# Patient Record
Sex: Female | Born: 1955 | Race: White | Hispanic: No | Marital: Married | Smoking: Never smoker
Health system: Southern US, Community
[De-identification: ages and names within clinical notes are randomized; demographics above are authoritative.]

## PROBLEM LIST (undated history)

## (undated) DIAGNOSIS — I10 Essential (primary) hypertension: Secondary | ICD-10-CM

## (undated) DIAGNOSIS — M810 Age-related osteoporosis without current pathological fracture: Secondary | ICD-10-CM

---

## 2007-07-27 ENCOUNTER — Ambulatory Visit: Payer: Self-pay | Admitting: Gastroenterology

## 2011-05-15 ENCOUNTER — Ambulatory Visit: Payer: Self-pay

## 2012-02-18 ENCOUNTER — Ambulatory Visit: Payer: Self-pay | Admitting: Medical

## 2014-09-20 ENCOUNTER — Ambulatory Visit: Payer: Self-pay | Admitting: Physician Assistant

## 2014-09-20 LAB — URINALYSIS, COMPLETE
BACTERIA: NEGATIVE
BILIRUBIN, UR: NEGATIVE
Glucose,UR: NEGATIVE
KETONE: NEGATIVE
Leukocyte Esterase: NEGATIVE
NITRITE: NEGATIVE
PH: 7 (ref 5.0–8.0)
Protein: NEGATIVE
Specific Gravity: 1.01 (ref 1.000–1.030)
Squamous Epithelial: NONE SEEN
WBC UR: NONE SEEN /HPF (ref 0–5)

## 2014-09-22 LAB — URINE CULTURE

## 2017-06-04 ENCOUNTER — Other Ambulatory Visit: Payer: Self-pay | Admitting: Family Medicine

## 2017-06-04 ENCOUNTER — Ambulatory Visit: Payer: 59

## 2017-06-04 DIAGNOSIS — M79662 Pain in left lower leg: Secondary | ICD-10-CM

## 2017-08-12 ENCOUNTER — Other Ambulatory Visit: Payer: Self-pay | Admitting: Student

## 2017-08-12 DIAGNOSIS — S83512S Sprain of anterior cruciate ligament of left knee, sequela: Secondary | ICD-10-CM

## 2017-08-18 ENCOUNTER — Encounter: Payer: Self-pay | Admitting: Radiology

## 2017-08-18 ENCOUNTER — Ambulatory Visit
Admission: RE | Admit: 2017-08-18 | Discharge: 2017-08-18 | Disposition: A | Discharge status: Home / Self Care | Payer: Commercial Managed Care - HMO | Source: Ambulatory Visit | Attending: Student | Admitting: Student

## 2017-08-18 DIAGNOSIS — S83521S Sprain of posterior cruciate ligament of right knee, sequela: Secondary | ICD-10-CM | POA: Diagnosis not present

## 2017-08-18 DIAGNOSIS — X58XXXS Exposure to other specified factors, sequela: Secondary | ICD-10-CM | POA: Diagnosis not present

## 2017-08-18 DIAGNOSIS — S83512S Sprain of anterior cruciate ligament of left knee, sequela: Secondary | ICD-10-CM

## 2019-11-30 ENCOUNTER — Emergency Department
Admission: EM | Admit: 2019-11-30 | Discharge: 2019-11-30 | Disposition: A | Discharge status: Home / Self Care | Payer: BC Managed Care – PPO | Attending: Emergency Medicine | Admitting: Emergency Medicine

## 2019-11-30 ENCOUNTER — Emergency Department: Payer: BC Managed Care – PPO

## 2019-11-30 ENCOUNTER — Other Ambulatory Visit: Payer: Self-pay

## 2019-11-30 DIAGNOSIS — I10 Essential (primary) hypertension: Secondary | ICD-10-CM | POA: Insufficient documentation

## 2019-11-30 DIAGNOSIS — R0789 Other chest pain: Secondary | ICD-10-CM | POA: Insufficient documentation

## 2019-11-30 DIAGNOSIS — Z79899 Other long term (current) drug therapy: Secondary | ICD-10-CM | POA: Diagnosis not present

## 2019-11-30 DIAGNOSIS — R079 Chest pain, unspecified: Secondary | ICD-10-CM | POA: Diagnosis present

## 2019-11-30 HISTORY — DX: Essential (primary) hypertension: I10

## 2019-11-30 HISTORY — DX: Age-related osteoporosis without current pathological fracture: M81.0

## 2019-11-30 LAB — BASIC METABOLIC PANEL
Anion gap: 8 (ref 5–15)
BUN: 21 mg/dL (ref 8–23)
CO2: 27 mmol/L (ref 22–32)
Calcium: 9.1 mg/dL (ref 8.9–10.3)
Chloride: 107 mmol/L (ref 98–111)
Creatinine, Ser: 0.9 mg/dL (ref 0.44–1.00)
GFR calc Af Amer: >60 mL/min (ref >60)
GFR calc non Af Amer: >60 mL/min (ref >60)
Glucose, Bld: 98 mg/dL (ref 70–99)
Potassium: 4 mmol/L (ref 3.5–5.1)
Sodium: 142 mmol/L (ref 135–145)

## 2019-11-30 LAB — CBC
HCT: 40.4 % (ref 36.0–46.0)
Hemoglobin: 13.3 g/dL (ref 12.0–15.0)
MCH: 30.9 pg (ref 26.0–34.0)
MCHC: 32.9 g/dL (ref 30.0–36.0)
MCV: 93.7 fL (ref 80.0–100.0)
Platelets: 195 10*3/uL (ref 150–400)
RBC: 4.31 MIL/uL (ref 3.87–5.11)
RDW: 13.3 % (ref 11.5–15.5)
WBC: 6.7 10*3/uL (ref 4.0–10.5)
nRBC: 0 % (ref 0.0–0.2)

## 2019-11-30 LAB — TROPONIN I (HIGH SENSITIVITY)
Troponin I (High Sensitivity): 4 ng/L (ref <18)
Troponin I (High Sensitivity): 5 ng/L (ref <18)

## 2019-11-30 NOTE — ED Notes (Signed)
followup discussed with patient.

## 2019-11-30 NOTE — Discharge Instructions (Addendum)
Return to the ER for new, worsening, or persistent severe chest pain, difficulty breathing, weakness or lightheadedness, or any other new or worsening symptoms that concern you.  Follow-up with your primary care doctor. 

## 2019-11-30 NOTE — ED Provider Notes (Signed)
[facility] Emergency Department Provider Note ____________________________________________   First MD Initiated Contact with Patient 11/30/19 0500     (approximate)  I have reviewed the triage vital signs and the nursing notes.   HISTORY  Chief Complaint Chest Pain    HPI Melissa Wood is a 64 y.o. female with PMH as noted below (and no prior cardiac history) who presents with chest pain, acute onset about 1 hour prior to ED arrival, mainly occurring under her left breast and radiating to left arm, and described as both sharp but also like a tightness.  She states that she felt like it took her breath away.  She had 2 episodes of this pain.  It has now mostly subsided.  She states she had some nausea and felt lightheaded, but did not vomit.  She does not currently feel short of breath.  She has had no cough or fever.  She was feeling fine yesterday other than having a migraine headache.  She denies any prior history of this pain.  Past Medical History:  Diagnosis Date  . Hypertension   . Osteoporosis     There are no problems to display for this patient.   History reviewed. No pertinent surgical history.  Prior to Admission medications   Medication Sig Start Date End Date Taking? Authorizing Provider  Calcium Carb-Cholecalciferol (OYSTER SHELL CALCIUM) 500-400 MG-UNIT TABS Take 1 tablet by mouth 2 (two) times daily.   Yes [provider]  diphenhydrAMINE ([employer] ALLERGY) 25 MG tablet Take 25 mg by mouth at bedtime.   Yes [provider]  fluticasone (FLONASE) 50 MCG/ACT nasal spray Place 2 sprays into the nose daily as needed.   Yes [provider]  lisinopril (ZESTRIL) 5 MG tablet Take 5 mg by mouth daily. 07/25/19 01/25/20 Yes [provider]  magic mouthwash SOLN Take 5 mLs by mouth See admin instructions. Swish and spit 5 mls every 6  hours as needed. 08/30/18  Yes [provider]  naproxen sodium (ALEVE) 220  MG tablet Take 440 mg by mouth daily as needed.   Yes [provider]  Olopatadine HCl (PAZEO) 0.7 % SOLN Place 1 drop into both eyes daily as needed. 04/08/16  Yes [provider]  prednisoLONE acetate (PRED FORTE) 1 % ophthalmic suspension Place 1 drop into both eyes daily as needed. 11/27/15  Yes [provider]  Probiotic Product (PROBIOTIC-10 PO) Take 1 capsule by mouth daily.   Yes [provider]  raloxifene (EVISTA) 60 MG tablet Take 60 mg by mouth daily. 09/22/19  Yes [provider]  rizatriptan (MAXALT) 10 MG tablet 10 mg See admin instructions. Take 1 tablet by mouth once as needed for migraine. 07/25/19  Yes [provider]    Allergies Aspirin  No family history on file.  Social History Social History   Tobacco Use  . Smoking status: Never Smoker  . Smokeless tobacco: Never Used  Substance Use Topics  . Alcohol use: Not on file  . Drug use: Not on file    Review of Systems  Constitutional: No fever. Eyes: No redness. ENT: No sore throat. Cardiovascular: Positive for resolving chest pain. Respiratory: Positive for resolved shortness of breath. Gastrointestinal: No vomiting or diarrhea.  Genitourinary: Negative for flank pain.  Musculoskeletal: Negative for back pain. Skin: Negative for rash. Neurological: Negative for headache.   ____________________________________________   PHYSICAL EXAM:  VITAL SIGNS: ED Triage Vitals  Enc Vitals Group  BP 11/30/19 0440 (!) 161/94     Pulse Rate 11/30/19 0440 77     Resp 11/30/19 0440 16     Temp 11/30/19 0440 97.8 F (36.6 C)     Temp Source 11/30/19 0440 Oral     SpO2 11/30/19 0434 100 %     Weight 11/30/19 0435 135 lb (61.2 kg)     Height 11/30/19 0435 5\' 4"  (1.626 m)     Head Circumference --      Peak Flow --      Pain Score 11/30/19 0435 1     Pain Loc --      Pain Edu? --      Excl. in GC? --     Constitutional: Alert and oriented. Well  appearing and in no acute distress. Eyes: Conjunctivae are normal.  Head: Atraumatic. Nose: No congestion/rhinnorhea. Mouth/Throat: Mucous membranes are moist.   Neck: Normal range of motion.  Cardiovascular: Normal rate, regular rhythm. Grossly normal heart sounds.  Good peripheral circulation.  No chest wall tenderness. Respiratory: Normal respiratory effort.  No retractions. Lungs CTAB. Gastrointestinal: No distention.  Musculoskeletal: No lower extremity edema.  No calf or popliteal swelling or tenderness.  Extremities warm and well perfused.  Neurologic:  Normal speech and language. No gross focal neurologic deficits are appreciated.  Skin:  Skin is warm and dry. No rash noted. Psychiatric: Mood and affect are normal. Speech and behavior are normal.  ____________________________________________   LABS (all labs ordered are listed, but only abnormal results are displayed)  Labs Reviewed  BASIC METABOLIC PANEL  CBC  TROPONIN I (HIGH SENSITIVITY)  TROPONIN I (HIGH SENSITIVITY)   ____________________________________________  EKG  ED ECG REPORT I, 12/02/19, the attending physician, personally viewed and interpreted this ECG.  Date: 11/30/2019 EKG Time: 0437 Rate: 75 Rhythm: normal sinus rhythm QRS Axis: normal Intervals: normal ST/T Wave abnormalities: normal Narrative Interpretation: no evidence of acute ischemia  ____________________________________________  RADIOLOGY  CXR: No focal infiltrate or other acute abnormality ____________________________________________   PROCEDURES  Procedure(s) performed: No  Procedures  Critical Care performed: No ____________________________________________   INITIAL IMPRESSION / ASSESSMENT AND PLAN / ED COURSE  Pertinent labs & imaging results that were available during my care of the patient were reviewed by me and considered in my medical decision making (see chart for details).  64 year old female with PMH  as noted above and no prior cardiac history presents with atypical chest pain which awoke her from sleep, and which was described as both sharp and pressure-like.  The patient received nitroglycerin by EMS, and states that the pain is now mostly subsided.  She has no prior history of this.  On exam, she is overall well-appearing.  Her vital signs are normal.  The physical exam is unremarkable.  EKG shows no ischemic findings.  Overall presentation is not consistent with ACS.  Given the patient's age and some of the associated symptoms, as well as the fact that she came in soon after the pain started, we will obtain troponins x2 to rule out ACS.  Also given the improvement in the pain and the lack of tachycardia or hypoxia, I do not suspect a PE.  There is no evidence of aortic dissection or other vascular etiology.  Presentation is most consistent with muscular or nerve pain especially given the fact that it woke her up from sleep and appeared to be somewhat positional initially.  ----------------------------------------- 7:31 AM on 11/30/2019 -----------------------------------------  Initial and repeat troponin are both  negative.  There is no evidence of ACS.  The patient continues to have no recurrence of the severe chest pain in the ED.  She is stable for discharge.  I counseled her on the results of the work-up.  Return precautions given, and she expresses understanding.  ____________________________________________   FINAL CLINICAL IMPRESSION(S) / ED DIAGNOSES  Final diagnoses:  Atypical chest pain      NEW MEDICATIONS STARTED DURING THIS VISIT:  New Prescriptions   No medications on file     Note:  This document was prepared using Dragon voice recognition software and may include unintentional dictation errors.    Arta Silence, MD 11/30/19 [phone]

## 2019-11-30 NOTE — ED Triage Notes (Addendum)
Pt arrives to ED via [facility] from home with c/o chest pain that started at 3:30am. Pt reports left-sided chest pain, described as "tightness and pressure" that radiated into her left arm. Pt denies SHOB, but does report some lightheadedness and nausea. Pt denies any previous cardiac h/x; only medical history per pt is HTN and Osteoporosis. Pt was given 2 sprays of SL Nitro by EMS while en route, pt reports significant improvement in pain (down to 2/10 from 10/10). Pt is A&O, in NAD; RR even, regular, and unlabored. NOTE: Pt has an allergy to Aspirin

## 2021-02-18 ENCOUNTER — Other Ambulatory Visit: Payer: Self-pay

## 2021-02-18 ENCOUNTER — Ambulatory Visit
Admission: EM | Admit: 2021-02-18 | Discharge: 2021-02-18 | Disposition: A | Discharge status: Home / Self Care | Payer: Medicare HMO | Attending: Sports Medicine | Admitting: Sports Medicine

## 2021-02-18 ENCOUNTER — Encounter: Payer: Self-pay | Admitting: Emergency Medicine

## 2021-02-18 DIAGNOSIS — R102 Pelvic and perineal pain: Secondary | ICD-10-CM

## 2021-02-18 DIAGNOSIS — R350 Frequency of micturition: Secondary | ICD-10-CM | POA: Diagnosis present

## 2021-02-18 DIAGNOSIS — R339 Retention of urine, unspecified: Secondary | ICD-10-CM

## 2021-02-18 DIAGNOSIS — N390 Urinary tract infection, site not specified: Secondary | ICD-10-CM | POA: Diagnosis present

## 2021-02-18 DIAGNOSIS — R300 Dysuria: Secondary | ICD-10-CM

## 2021-02-18 LAB — URINALYSIS, COMPLETE (UACMP) WITH MICROSCOPIC
Bilirubin Urine: NEGATIVE
Glucose, UA: NEGATIVE mg/dL
Ketones, ur: NEGATIVE mg/dL
Nitrite: NEGATIVE
Protein, ur: NEGATIVE mg/dL
Specific Gravity, Urine: 1.01 (ref 1.005–1.030)
pH: 7 (ref 5.0–8.0)

## 2021-02-18 MED ORDER — NITROFURANTOIN MONOHYD MACRO 100 MG PO CAPS: 100.0000 mg | ORAL_CAPSULE | Freq: Two times a day (BID) | ORAL | 0 refills | Status: DC

## 2021-02-18 MED ORDER — PHENAZOPYRIDINE HCL 200 MG PO TABS: 200.0000 mg | ORAL_TABLET | Freq: Three times a day (TID) | ORAL | 0 refills | Status: AC

## 2021-02-18 NOTE — ED Triage Notes (Signed)
Pt c/o urinary frequency, retention, and lower abdominal pain. Started yesterday. Denies lower back pain.

## 2021-02-18 NOTE — ED Provider Notes (Signed)
[facility]    CSN: [id number] Arrival date & time: 02/18/21  1614      History   Chief Complaint Chief Complaint  Patient presents with  . Urinary Retention    HPI Melissa Wood is a 65 y.o. female.   Patient is a pleasant 65 year old female who presents for evaluation of the above issues.  Normally sees [facility] but they were unavailable to see her.  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  She reports urinary tract infection symptoms that began yesterday.  She reports a low-grade fever with associated lower suprapubic abdominal pressure.  She also reports dysuria, urinary frequency, increased urinary urgency, and incomplete voiding.  She denies back pain or flank pain.  She has been drinking a lot of water and trying to flush her system.  She denies any pelvic pain or vaginal discharge.  No blood in the urine.  No vaginal bleeding.  She denies any nausea vomiting or diarrhea.  She also denies any chest pain or shortness of breath.  No red flag signs or symptoms elicited on history.      Past Medical History:  Diagnosis Date  . Hypertension   . Osteoporosis     There are no problems to display for this patient.   History reviewed. No pertinent surgical history.  OB History   No obstetric history on file.      Home Medications    Prior to Admission medications   Medication Sig Start Date End Date Taking? Authorizing Provider  Calcium Carb-Cholecalciferol (OYSTER SHELL CALCIUM) 500-400 MG-UNIT TABS Take 1 tablet by mouth 2 (two) times daily.   Yes [provider]  diphenhydrAMINE (BENADRYL) 25 MG tablet Take 25 mg by mouth at bedtime.   Yes [provider]  fluticasone (FLONASE) 50 MCG/ACT nasal spray Place 2 sprays into the nose daily as needed.   Yes [provider]  lisinopril (ZESTRIL) 5 MG tablet Take 5 mg by mouth daily. 07/25/19 02/18/21 Yes [provider]  metoprolol succinate  (TOPROL-XL) 25 MG 24 hr tablet Take 1 tablet by mouth daily. 01/21/21  Yes [provider]  naproxen sodium (ALEVE) 220 MG tablet Take 440 mg by mouth daily as needed.   Yes [provider]  NIFEdipine (ADALAT CC) 30 MG 24 hr tablet Take 30 mg by mouth daily. 01/24/21  Yes [provider]  nitrofurantoin, macrocrystal-monohydrate, (MACROBID) 100 MG capsule Take 1 capsule (100 mg total) by mouth 2 (two) times daily. 02/18/21  Yes Delton See, MD  Olopatadine HCl (PAZEO) 0.7 % SOLN Place 1 drop into both eyes daily as needed. 04/08/16  Yes [provider]  phenazopyridine (PYRIDIUM) 200 MG tablet Take 1 tablet (200 mg total) by mouth 3 (three) times daily. 02/18/21  Yes Delton See, MD  prednisoLONE acetate (PRED FORTE) 1 % ophthalmic suspension Place 1 drop into both eyes daily as needed. 11/27/15  Yes [provider]  Probiotic Product (PROBIOTIC-10 PO) Take 1 capsule by mouth daily.   Yes [provider]  raloxifene (EVISTA) 60 MG tablet Take 60 mg by mouth daily. 09/22/19  Yes [provider]  magic mouthwash SOLN Take 5 mLs by mouth See admin instructions. Swish and spit 5 mls every 6  hours as needed. 08/30/18   [provider]  rizatriptan (MAXALT) 10 MG tablet 10 mg See admin instructions. Take 1 tablet by mouth once as needed for migraine. 07/25/19   [provider]    Family History History reviewed. No pertinent family history.  Social History Social History   Tobacco Use  . Smoking status: Never Smoker  . Smokeless tobacco: Never Used  Vaping Use  . Vaping Use: Never used  Substance Use Topics  . Alcohol use: Not Currently  . Drug use: Not Currently     Allergies   Aspirin   Review of Systems Review of Systems  Constitutional: Positive for fever. Negative for activity change, appetite change, chills, diaphoresis and fatigue.  HENT: Negative for congestion, ear pain, postnasal drip,  rhinorrhea, sinus pressure, sinus pain, sneezing and sore throat.   Eyes: Negative for pain.  Respiratory: Negative for cough, chest tightness and shortness of breath.   Cardiovascular: Negative for chest pain and palpitations.  Gastrointestinal: Positive for abdominal pain. Negative for diarrhea, nausea and vomiting.  Genitourinary: Positive for dysuria, frequency and urgency. Negative for flank pain, genital sores, hematuria, pelvic pain, vaginal bleeding, vaginal discharge and vaginal pain.  Musculoskeletal: Negative for back pain, myalgias and neck pain.  Skin: Negative for color change, pallor, rash and wound.  Neurological: Negative for dizziness, light-headedness and headaches.  All other systems reviewed and are negative.    Physical Exam Triage Vital Signs ED Triage Vitals  Enc Vitals Group     BP 02/18/21 1716 (!) 107/92     Pulse Rate 02/18/21 1716 80     Resp 02/18/21 1716 18     Temp 02/18/21 1716 98.5 F (36.9 C)     Temp Source 02/18/21 1716 Oral     SpO2 02/18/21 1716 100 %     Weight 02/18/21 1717 134 lb 14.7 oz (61.2 kg)     Height 02/18/21 1717 5\' 4"  (1.626 m)     Head Circumference --      Peak Flow --      Pain Score 02/18/21 1716 7     Pain Loc --      Pain Edu? --      Excl. in GC? --    No data found.  Updated Vital Signs BP (!) 107/92 (BP Location: Right Arm)   Pulse 80   Temp 98.5 F (36.9 C) (Oral)   Resp 18   Ht 5\' 4"  (1.626 m)   Wt 61.2 kg   SpO2 100%   BMI 23.16 kg/m   Visual Acuity Right Eye Distance:   Left Eye Distance:   Bilateral Distance:    Right Eye Near:   Left Eye Near:    Bilateral Near:     Physical Exam Vitals and nursing note reviewed.  Constitutional:      General: She is not in acute distress.    Appearance: Normal appearance. She is well-developed. She is not ill-appearing, toxic-appearing or diaphoretic.  HENT:     Head: Normocephalic and atraumatic.     Nose: Nose normal.     Mouth/Throat:     Mouth:  Mucous membranes are moist.  Eyes:     General: No scleral icterus.       Right eye: No discharge.        Left eye: No discharge.     Conjunctiva/sclera: Conjunctivae normal.     Pupils: Pupils are equal, round, and reactive to light.  Cardiovascular:     Rate and Rhythm: Normal rate and regular rhythm.     Pulses: Normal pulses.     Heart sounds: Normal heart sounds. No murmur heard. No friction rub. No gallop.   Pulmonary:  Effort: Pulmonary effort is normal. No respiratory distress.     Breath sounds: Normal breath sounds. No stridor. No wheezing, rhonchi or rales.  Abdominal:     General: Bowel sounds are normal. There is no distension.     Palpations: Abdomen is soft.     Tenderness: There is abdominal tenderness in the suprapubic area. There is no right CVA tenderness, left CVA tenderness, guarding or rebound.  Musculoskeletal:     Cervical back: Normal range of motion and neck supple. No rigidity or tenderness.  Skin:    General: Skin is warm and dry.     Capillary Refill: Capillary refill takes less than 2 seconds.     Findings: No bruising, erythema, lesion or rash.  Neurological:     General: No focal deficit present.     Mental Status: She is alert and oriented to person, place, and time.      UC Treatments / Results  Labs (all labs ordered are listed, but only abnormal results are displayed) Labs Reviewed  URINE CULTURE - Abnormal; Notable for the following components:      Result Value   Culture   (*)    Value: >=100,000 COLONIES/mL ESCHERICHIA COLI SUSCEPTIBILITIES TO FOLLOW Performed at [facility], [address] [place], [place] [address]    All other components within normal limits  URINALYSIS, COMPLETE (UACMP) WITH MICROSCOPIC - Abnormal; Notable for the following components:   Hgb urine dipstick MODERATE (*)    Leukocytes,Ua MODERATE (*)    Bacteria, UA MANY (*)    All other components within normal limits    EKG   Radiology No  results found.  Procedures Procedures (including critical care time)  Medications Ordered in UC Medications - No data to display  Initial Impression / Assessment and Plan / UC Course  I have reviewed the triage vital signs and the nursing notes.  Pertinent labs & imaging results that were available during my care of the patient were reviewed by me and considered in my medical decision making (see chart for details).  Clinical impression: 2 days of UTI symptoms including dysuria, increased urinary frequency, urinary retention, increased urinary urgency, and suprapubic abdominal pain..  Treatment plan: 1.  The findings and treatment plan were discussed in detail with the patient.  Patient was in agreement. 2.  Recommended getting a UA.  It did show moderate leukocytes, moderate amount of blood, 21-50 WBCs, and many bacteria.  Is consistent with a UTI.  We will treat with an antibiotic and send off the culture.  Also sent off a prescription for Pyridium to her pharmacy along with the antibiotic. 3.  Educational handouts provided. 4.  Plenty of rest, plenty fluids, Tylenol or Motrin for any fever or discomfort. 5.  I want her to flush her system with plenty of water. 6.  If symptoms persist she should see her primary care provider. 7.  If symptoms worsen in any way, she develops significant fever, abdominal pain, nausea and vomiting, flank pain, this could be a sign of an a sending infection including pyelonephritis and I advised her to go to the ER.  She voiced verbal understanding. 8.  She was discharged from care in stable condition and she will follow-up here as needed.    Final Clinical Impressions(s) / UC Diagnoses   Final diagnoses:  Lower urinary tract infectious disease  Dysuria  Urinary retention  Urinary frequency  Suprapubic abdominal pain     Discharge Instructions  Your urinalysis shows that you have a urinary tract infection.  I am treating with antibiotic and  something for the discomfort.  You can pick both of them up at your pharmacy. I sent off your urine for culture and sensitivity.  Someone may contact you and change the antibiotic. Please see educational handouts. I want you to flush her system with plenty of fluids including water.  You can also use cranberry juice. Tylenol or Motrin for any fever or discomfort. If you develop any fever worsening abdominal pain nausea vomiting or flank pain or back pain please go to the ER as this may be a sign of an ascending kidney infection that will require higher level of care. If your symptoms persist please see your primary care provider.  If they worsen please go to the ER.    ED Prescriptions    Medication Sig Dispense Auth. Provider   nitrofurantoin, macrocrystal-monohydrate, (MACROBID) 100 MG capsule Take 1 capsule (100 mg total) by mouth 2 (two) times daily. 10 capsule Delton See, MD   phenazopyridine (PYRIDIUM) 200 MG tablet Take 1 tablet (200 mg total) by mouth 3 (three) times daily. 6 tablet Delton See, MD     PDMP not reviewed this encounter.   Delton See, MD 02/21/21 [phone]

## 2021-02-18 NOTE — Discharge Instructions (Signed)
Your urinalysis shows that you have a urinary tract infection.  I am treating with antibiotic and something for the discomfort.  You can pick both of them up at your pharmacy. I sent off your urine for culture and sensitivity.  Someone may contact you and change the antibiotic. Please see educational handouts. I want you to flush her system with plenty of fluids including water.  You can also use cranberry juice. Tylenol or Motrin for any fever or discomfort. If you develop any fever worsening abdominal pain nausea vomiting or flank pain or back pain please go to the ER as this may be a sign of an ascending kidney infection that will require higher level of care. If your symptoms persist please see your primary care provider.  If they worsen please go to the ER.

## 2021-02-23 LAB — URINE CULTURE: Culture: 50000 — AB

## 2021-03-07 ENCOUNTER — Other Ambulatory Visit: Payer: Self-pay

## 2021-03-07 ENCOUNTER — Encounter: Payer: Self-pay | Admitting: Emergency Medicine

## 2021-03-07 ENCOUNTER — Ambulatory Visit
Admission: EM | Admit: 2021-03-07 | Discharge: 2021-03-07 | Disposition: A | Discharge status: Home / Self Care | Payer: Medicare HMO | Attending: Emergency Medicine | Admitting: Emergency Medicine

## 2021-03-07 DIAGNOSIS — R1030 Lower abdominal pain, unspecified: Secondary | ICD-10-CM | POA: Diagnosis present

## 2021-03-07 DIAGNOSIS — N390 Urinary tract infection, site not specified: Secondary | ICD-10-CM | POA: Insufficient documentation

## 2021-03-07 LAB — URINALYSIS, COMPLETE (UACMP) WITH MICROSCOPIC
Bilirubin Urine: NEGATIVE
Glucose, UA: NEGATIVE mg/dL
Ketones, ur: NEGATIVE mg/dL
Nitrite: NEGATIVE
Protein, ur: NEGATIVE mg/dL
Specific Gravity, Urine: <1.005 — ABNORMAL LOW (ref 1.005–1.030)
pH: 6 (ref 5.0–8.0)

## 2021-03-07 MED ORDER — CEPHALEXIN 500 MG PO CAPS: 500.0000 mg | ORAL_CAPSULE | Freq: Two times a day (BID) | ORAL | 0 refills | Status: AC

## 2021-03-07 NOTE — ED Triage Notes (Signed)
Patient states that she has had cough and fatigue for a week.  Patient reports abdominal pain that started on Tuesday.  Patient also reports fever off and on since Tuesday.  Patient saw her PCP on Wed and her covid test was negative.  Patient reports pain that is worse with urination and having a bowel movement.

## 2021-03-07 NOTE — Discharge Instructions (Addendum)
Your urine does appear consistent with a UTI which is consistent with your symptoms. Complete course of antibiotics.  If any worsening of symptoms I would recommend going to the ER for further evaluation, particularly fevers or worsening of right lower quadrant abdominal pain.

## 2021-03-07 NOTE — ED Provider Notes (Signed)
[facility]    CSN: [id number] Arrival date & time: 03/07/21  0854      History   Chief Complaint Chief Complaint  Patient presents with  . Cough  . Abdominal Pain    HPI Melissa Wood is a 65 y.o. female.   Melissa Wood presents with complaints of abdominal pain which started three days ago. Pain and frequency with urination. Increased pain this morning when she had a BM. She did endorse having to strain to pass stool and feeling more constipated over the past few days, which is not typical for her. 1 week ago she had fatigue which was followed by cough and sore throat as well as fevers up to 102. Cough and sore throat have since resolved. She tested negative for covid-19 two days ago with her PCP. Has had UTI's in the past and was treated for one 5/17. No previous abdominal surgeries. She has been taking naproxen which has seemed to help with her temperatures.    ROS per HPI, negative if not otherwise mentioned.      Past Medical History:  Diagnosis Date  . Hypertension   . Osteoporosis     There are no problems to display for this patient.   History reviewed. No pertinent surgical history.  OB History   No obstetric history on file.      Home Medications    Prior to Admission medications   Medication Sig Start Date End Date Taking? Authorizing Provider  cephALEXin (KEFLEX) 500 MG capsule Take 1 capsule (500 mg total) by mouth 2 (two) times daily for 7 days. 03/07/21 03/14/21 Yes Anahita Cua, Barron Alvine, NP  Calcium Carb-Cholecalciferol (OYSTER SHELL CALCIUM) 500-400 MG-UNIT TABS Take 1 tablet by mouth 2 (two) times daily.    [provider]  diphenhydrAMINE (BENADRYL) 25 MG tablet Take 25 mg by mouth at bedtime.    [provider]  fluticasone (FLONASE) 50 MCG/ACT nasal spray Place 2 sprays into the nose daily as needed.    [provider]  lisinopril (ZESTRIL) 5 MG tablet Take 5 mg by mouth daily. 07/25/19 02/18/21  [provider]  magic mouthwash SOLN Take 5 mLs by mouth See admin instructions. Swish and spit 5 mls every 6  hours as needed. 08/30/18   [provider]  metoprolol succinate (TOPROL-XL) 25 MG 24 hr tablet Take 1 tablet by mouth daily. 01/21/21   [provider]  naproxen sodium (ALEVE) 220 MG tablet Take 440 mg by mouth daily as needed.    [provider]  NIFEdipine (ADALAT CC) 30 MG 24 hr tablet Take 30 mg by mouth daily. 01/24/21   [provider]  Olopatadine HCl (PAZEO) 0.7 % SOLN Place 1 drop into both eyes daily as needed. 04/08/16   [provider]  phenazopyridine (PYRIDIUM) 200 MG tablet Take 1 tablet (200 mg total) by mouth 3 (three) times daily. 02/18/21   Delton See, MD  prednisoLONE acetate (PRED FORTE) 1 % ophthalmic suspension Place 1 drop into both eyes daily as needed. 11/27/15   [provider]  Probiotic Product (PROBIOTIC-10 PO) Take 1 capsule by mouth daily.    [provider]  raloxifene (EVISTA) 60 MG tablet Take 60 mg by mouth daily. 09/22/19   [provider]  rizatriptan (MAXALT) 10 MG tablet 10 mg See admin instructions. Take 1 tablet by mouth once as needed for migraine. 07/25/19   [provider]    Family History History reviewed.  No pertinent family history.  Social History Social History   Tobacco Use  . Smoking status: Never Smoker  . Smokeless tobacco: Never Used  Vaping Use  . Vaping Use: Never used  Substance Use Topics  . Alcohol use: Not Currently  . Drug use: Not Currently     Allergies   Aspirin   Review of Systems Review of Systems   Physical Exam Triage Vital Signs ED Triage Vitals  Enc Vitals Group     BP 03/07/21 0921 102/87     Pulse Rate 03/07/21 0921 78     Resp 03/07/21 0921 14     Temp 03/07/21 0921 98.3 F (36.8 C)     Temp Source 03/07/21 0921 Oral     SpO2 03/07/21 0921 100 %     Weight 03/07/21 0918 138 lb (62.6 kg)     Height  03/07/21 0918 5\' 3"  (1.6 m)     Head Circumference --      Peak Flow --      Pain Score 03/07/21 0918 5     Pain Loc --      Pain Edu? --      Excl. in GC? --    No data found.  Updated Vital Signs BP 102/87 (BP Location: Right Arm)   Pulse 78   Temp 98.3 F (36.8 C) (Oral)   Resp 14   Ht 5\' 3"  (1.6 m)   Wt 138 lb (62.6 kg)   SpO2 100%   BMI 24.45 kg/m   Visual Acuity Right Eye Distance:   Left Eye Distance:   Bilateral Distance:    Right Eye Near:   Left Eye Near:    Bilateral Near:     Physical Exam Constitutional:      General: She is not in acute distress.    Appearance: She is well-developed.  Cardiovascular:     Rate and Rhythm: Normal rate and regular rhythm.  Pulmonary:     Effort: Pulmonary effort is normal.     Breath sounds: Normal breath sounds.  Abdominal:     Tenderness: There is abdominal tenderness in the right lower quadrant and suprapubic area. There is no right CVA tenderness or left CVA tenderness.  Skin:    General: Skin is warm and dry.  Neurological:     Mental Status: She is alert and oriented to person, place, and time.      UC Treatments / Results  Labs (all labs ordered are listed, but only abnormal results are displayed) Labs Reviewed  URINALYSIS, COMPLETE (UACMP) WITH MICROSCOPIC - Abnormal; Notable for the following components:      Result Value   Specific Gravity, Urine <1.005 (*)    Hgb urine dipstick TRACE (*)    Leukocytes,Ua TRACE (*)    Bacteria, UA RARE (*)    All other components within normal limits  URINE CULTURE    EKG   Radiology No results found.  Procedures Procedures (including critical care time)  Medications Ordered in UC Medications - No data to display  Initial Impression / Assessment and Plan / UC Course  I have reviewed the triage vital signs and the nursing notes.  Pertinent labs & imaging results that were available during my care of the patient were reviewed by me and considered in my  medical decision making (see chart for details).     Urine with low specific gravity yet still with hgb, leuks and bacteria, which is consistent with UTI. Patient with polyuria  while here, as well. Appendicitis also considered and discussed with patient, given tenderness on exam, with strict ER precautions provided if worsening or no improvement in the next 24 hours with antibiotics. Patient verbalized understanding and agreeable to plan.   Final Clinical Impressions(s) / UC Diagnoses   Final diagnoses:  Lower urinary tract infectious disease  Lower abdominal pain     Discharge Instructions     Your urine does appear consistent with a UTI which is consistent with your symptoms. Complete course of antibiotics.  If any worsening of symptoms I would recommend going to the ER for further evaluation, particularly fevers or worsening of right lower quadrant abdominal pain.     ED Prescriptions    Medication Sig Dispense Auth. Provider   cephALEXin (KEFLEX) 500 MG capsule Take 1 capsule (500 mg total) by mouth 2 (two) times daily for 7 days. 14 capsule Georgetta Haber, NP     PDMP not reviewed this encounter.   Georgetta Haber, NP 03/07/21 1055

## 2021-03-10 LAB — URINE CULTURE: Culture: >=100000 — AB

## 2021-10-03 IMAGING — CR DG CHEST 2V
2 series · 2 of 2 positions shown · non-contrast
Comparison: None.

CLINICAL DATA: Left chest pain

EXAM:
CHEST - 2 VIEW

[chest pa]
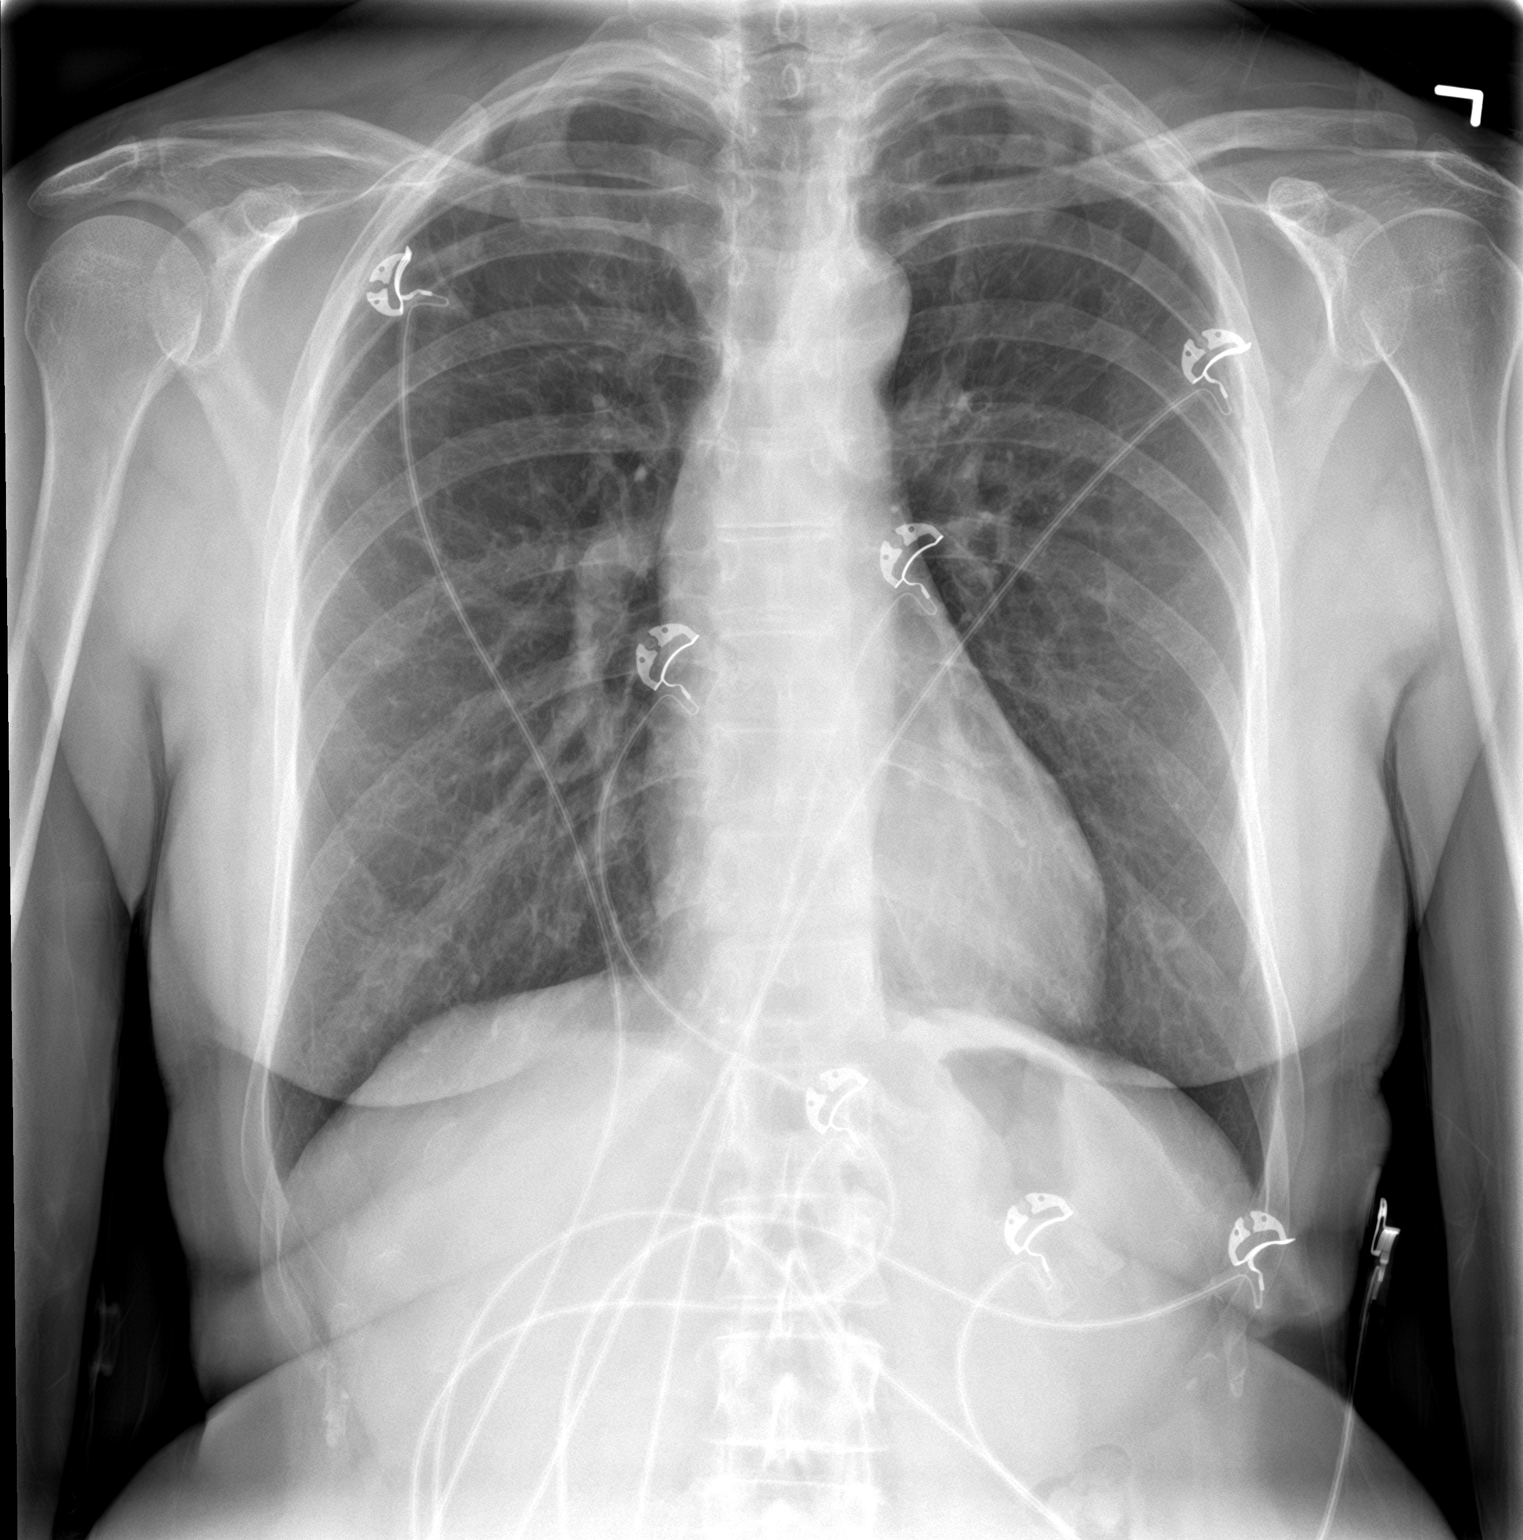

[chest lat]
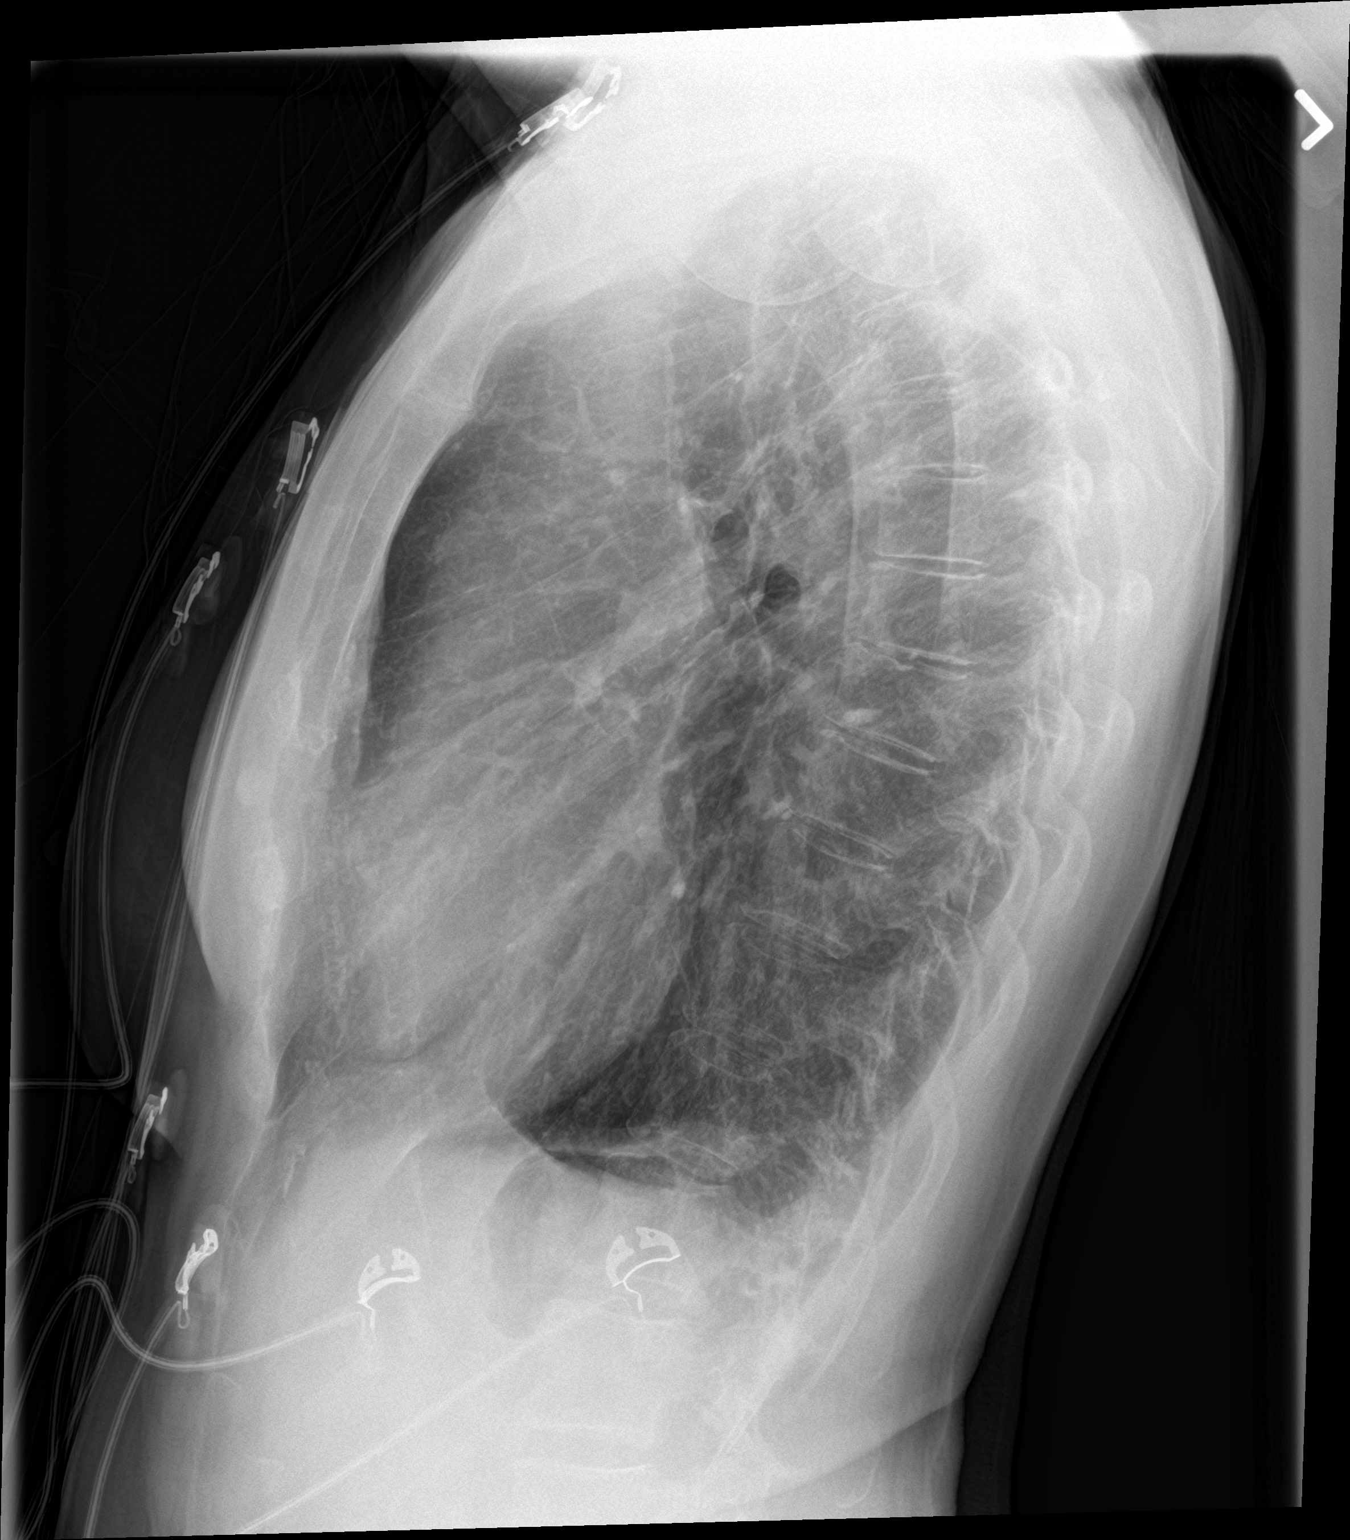

[2 of 2 positions shown; findings below may reference images not displayed]

FINDINGS: Normal heart size and mediastinal contours. No acute infiltrate or
edema. Biapical reticulation best attributed to scarring. No
effusion or pneumothorax. No acute osseous findings.
IMPRESSION: No active cardiopulmonary disease.

## 2022-03-18 LAB — COLOGUARD: COLOGUARD: NEGATIVE

## 2024-10-11 ENCOUNTER — Other Ambulatory Visit: Payer: Self-pay | Admitting: Physician Assistant

## 2024-10-11 DIAGNOSIS — Z780 Asymptomatic menopausal state: Secondary | ICD-10-CM

## 2024-10-11 DIAGNOSIS — Z1231 Encounter for screening mammogram for malignant neoplasm of breast: Secondary | ICD-10-CM

## 2024-12-04 ENCOUNTER — Ambulatory Visit
